# Patient Record
Sex: Male | Born: 1985 | Race: White | Hispanic: No | Marital: Married | Smoking: Current every day smoker
Health system: Southern US, Community
[De-identification: ages and names within clinical notes are randomized; demographics above are authoritative.]

## PROBLEM LIST (undated history)

## (undated) HISTORY — PX: JOINT REPLACEMENT: SHX530

---

## 2020-09-11 ENCOUNTER — Encounter (HOSPITAL_COMMUNITY): Payer: Self-pay | Admitting: Emergency Medicine

## 2020-09-11 ENCOUNTER — Other Ambulatory Visit: Payer: Self-pay

## 2020-09-11 DIAGNOSIS — Y990 Civilian activity done for income or pay: Secondary | ICD-10-CM | POA: Diagnosis not present

## 2020-09-11 DIAGNOSIS — M79675 Pain in left toe(s): Secondary | ICD-10-CM | POA: Insufficient documentation

## 2020-09-11 DIAGNOSIS — S99922A Unspecified injury of left foot, initial encounter: Secondary | ICD-10-CM | POA: Diagnosis not present

## 2020-09-11 DIAGNOSIS — X500XXA Overexertion from strenuous movement or load, initial encounter: Secondary | ICD-10-CM | POA: Insufficient documentation

## 2020-09-11 DIAGNOSIS — F17210 Nicotine dependence, cigarettes, uncomplicated: Secondary | ICD-10-CM | POA: Insufficient documentation

## 2020-09-11 NOTE — ED Triage Notes (Signed)
Pt brought in by [facility] for c/o left toe pain from work. Pt was using dolly and hyperextend left toes.

## 2020-09-12 ENCOUNTER — Emergency Department (HOSPITAL_COMMUNITY)
Admission: EM | Admit: 2020-09-12 | Discharge: 2020-09-12 | Disposition: A | Discharge status: Home / Self Care | Payer: Worker's Compensation | Attending: Emergency Medicine | Admitting: Emergency Medicine

## 2020-09-12 ENCOUNTER — Emergency Department (HOSPITAL_COMMUNITY): Payer: Worker's Compensation

## 2020-09-12 DIAGNOSIS — M79673 Pain in unspecified foot: Secondary | ICD-10-CM

## 2020-09-12 MED ORDER — OXYCODONE HCL 5 MG PO TABS: 5.0000 mg | ORAL_TABLET | ORAL | 0 refills | Status: AC | PRN

## 2020-09-12 MED ORDER — HYDROCODONE-ACETAMINOPHEN 5-325 MG PO TABS
1.0000 | ORAL_TABLET | Freq: Once | ORAL | Status: AC
  Administered 2020-09-12: 1 via ORAL
  Filled 2020-09-12: qty 1

## 2020-09-12 NOTE — Discharge Instructions (Signed)
You were evaluated in the Emergency Department and after careful evaluation, we did not find any emergent condition requiring admission or further testing in the hospital.  Your exam/testing today is overall reassuring.  X-ray did not show any broken bones or emergencies.  Recommend rest, Tylenol, Motrin.  Can use the oxycodone tablets for more significant pain.  If still hurting after 2 weeks recommend follow-up with an orthopedic specialist.  Please return to the Emergency Department if you experience any worsening of your condition.   Thank you for allowing [place] to be a part of your care.

## 2020-09-12 NOTE — ED Provider Notes (Signed)
[facility]-EMERGENCY DEPT [facility] Emergency Department Provider Note MRN:  [id number]  Arrival date & time: 09/12/20     Chief Complaint   Toe Injury   History of Present Illness   Barry Huff is a 35 y.o. year-old male with no pertinent past medical history presenting to the ED with chief complaint of toe pain.  Patient was at work trying to stop a heavy object from sliding.  Tried to hold his foot out.  Object was over 200 pounds.  Felt hyperextension of his toes and is having continued foot pain since then.  Denies any other injuries.  Pain is moderate, constant, worse with motion or palpation.  Review of Systems  A complete 10 system review of systems was obtained and all systems are negative except as noted in the HPI and PMH.   Patient's Health History   No past medical history on file.    No family history on file.  Social History   Socioeconomic History   Marital status: Married    Spouse name: Not on file   Number of children: Not on file   Years of education: Not on file   Highest education level: Not on file  Occupational History   Not on file  Tobacco Use   Smoking status: Every Day    Types: Cigarettes   Smokeless tobacco: Never  Substance and Sexual Activity   Alcohol use: Not on file   Drug use: Not on file   Sexual activity: Not on file  Other Topics Concern   Not on file  Social History Narrative   Not on file   Social Determinants of Health   Financial Resource Strain: Not on file  Food Insecurity: Not on file  Transportation Needs: Not on file  Physical Activity: Not on file  Stress: Not on file  Social Connections: Not on file  Intimate Partner Violence: Not on file     Physical Exam   Vitals:   09/11/20 2359  BP: (!) 147/91  Pulse: 89  Resp: 18  Temp: 98.5 F (36.9 C)  SpO2: 100%    CONSTITUTIONAL: Well-appearing, NAD NEURO:  Alert and oriented x 3, no focal deficits EYES:  eyes equal and reactive ENT/NECK:  no LAD, no  JVD CARDIO: Regular rate, well-perfused, normal S1 and S2 PULM:  CTAB no wheezing or rhonchi GI/GU:  normal bowel sounds, non-distended, non-tender MSK/SPINE:  No gross deformities, no edema SKIN:  no rash, atraumatic PSYCH:  Appropriate speech and behavior  *Additional and/or pertinent findings included in MDM below  Diagnostic and Interventional Summary    EKG Interpretation  Date/Time:    Ventricular Rate:    PR Interval:    QRS Duration:   QT Interval:    QTC Calculation:   R Axis:     Text Interpretation:         Labs Reviewed - No data to display  DG Foot Complete Left  Final Result      Medications  HYDROcodone-acetaminophen (NORCO/VICODIN) 5-325 MG per tablet 1 tablet (has no administration in time range)     Procedures  /  Critical Care Procedures  ED Course and Medical Decision Making  I have reviewed the triage vital signs, the nursing notes, and pertinent available records from the EMR.  Listed above are laboratory and imaging tests that I personally ordered, reviewed, and interpreted and then considered in my medical decision making (see below for details).  Foot is overall nontraumatic in appearance.  There is  tenderness across the MTPs.  X-ray is normal.  Neurovascularly intact.  No other injuries.  Advised orthopedic follow-up if still hurting after 2 weeks.  Appropriate for discharge.       Elmer Sow. Pilar Plate, MD [facility] Emergency [facility] [email or web address]  Final Clinical Impressions(s) / ED Diagnoses     ICD-10-CM   1. Pain of foot, unspecified laterality  M79.673       ED Discharge Orders          Ordered    oxyCODONE (ROXICODONE) 5 MG immediate release tablet  Every 4 hours PRN        09/12/20 0302             Discharge Instructions Discussed with and Provided to Patient:    Discharge Instructions      You were evaluated in the Emergency Department and after careful evaluation, we did not  find any emergent condition requiring admission or further testing in the hospital.  Your exam/testing today is overall reassuring.  X-ray did not show any broken bones or emergencies.  Recommend rest, Tylenol, Motrin.  Can use the oxycodone tablets for more significant pain.  If still hurting after 2 weeks recommend follow-up with an orthopedic specialist.  Please return to the Emergency Department if you experience any worsening of your condition.   Thank you for allowing [place] to be a part of your care.       Sabas Sous, MD 09/12/20 [phone]

## 2022-06-28 IMAGING — DX DG FOOT COMPLETE 3+V*L*
3 series · 3 of 3 positions shown · non-contrast
Comparison: None.

CLINICAL DATA: Hyperextension of the left toes

EXAM:
LEFT FOOT - COMPLETE 3+ VIEW

[foot ap]
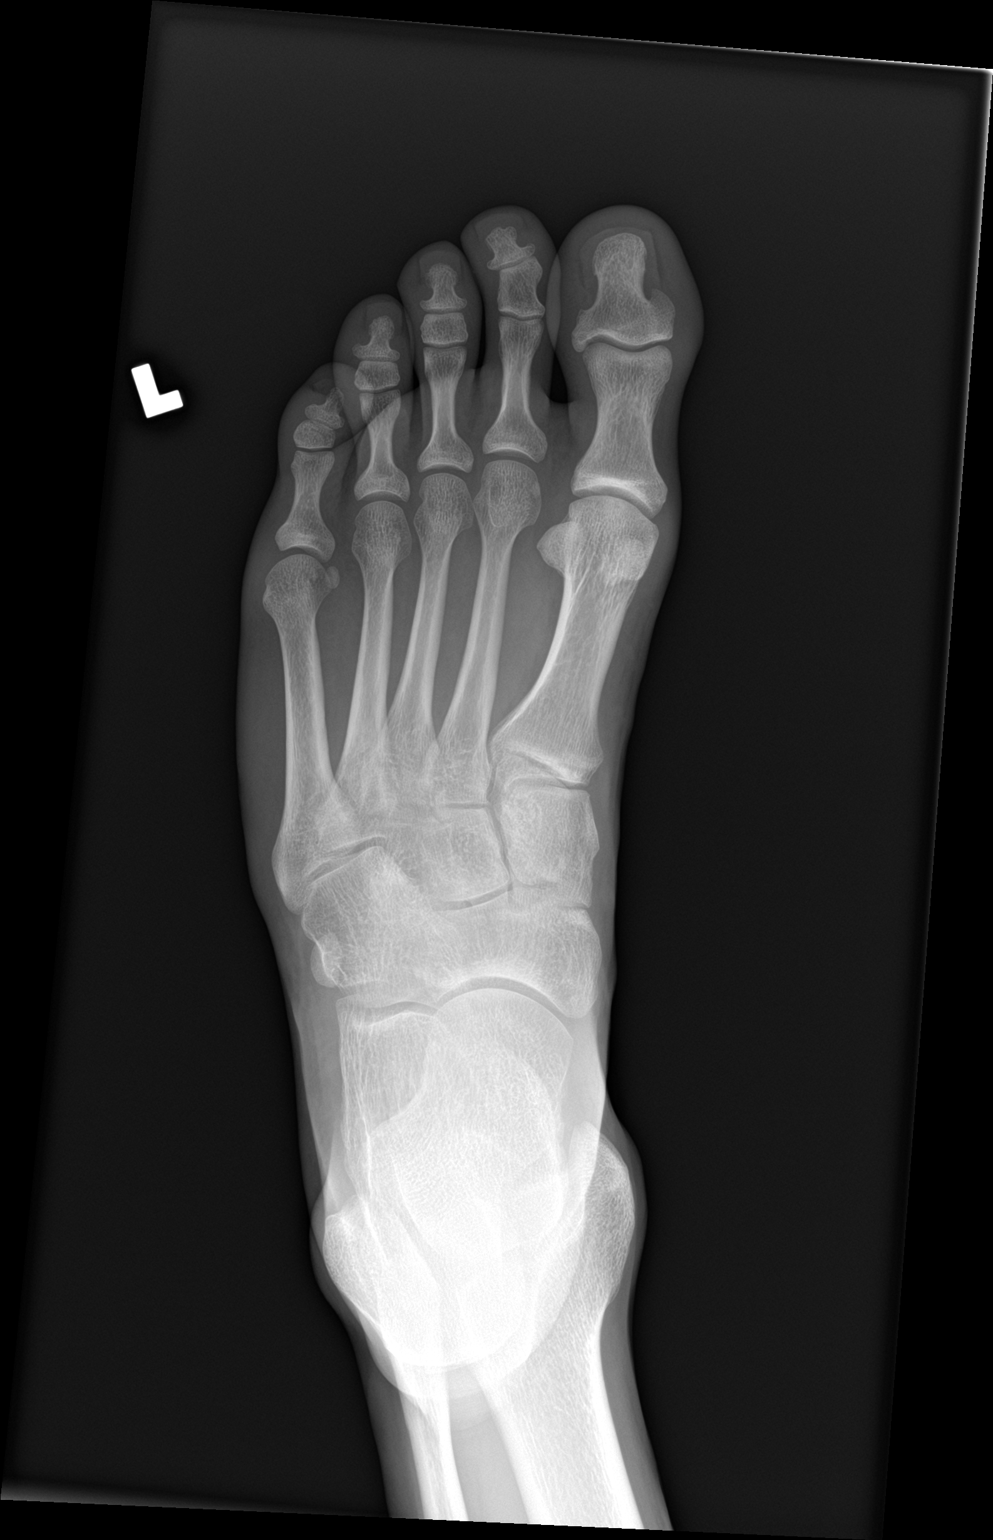

[foot obl]
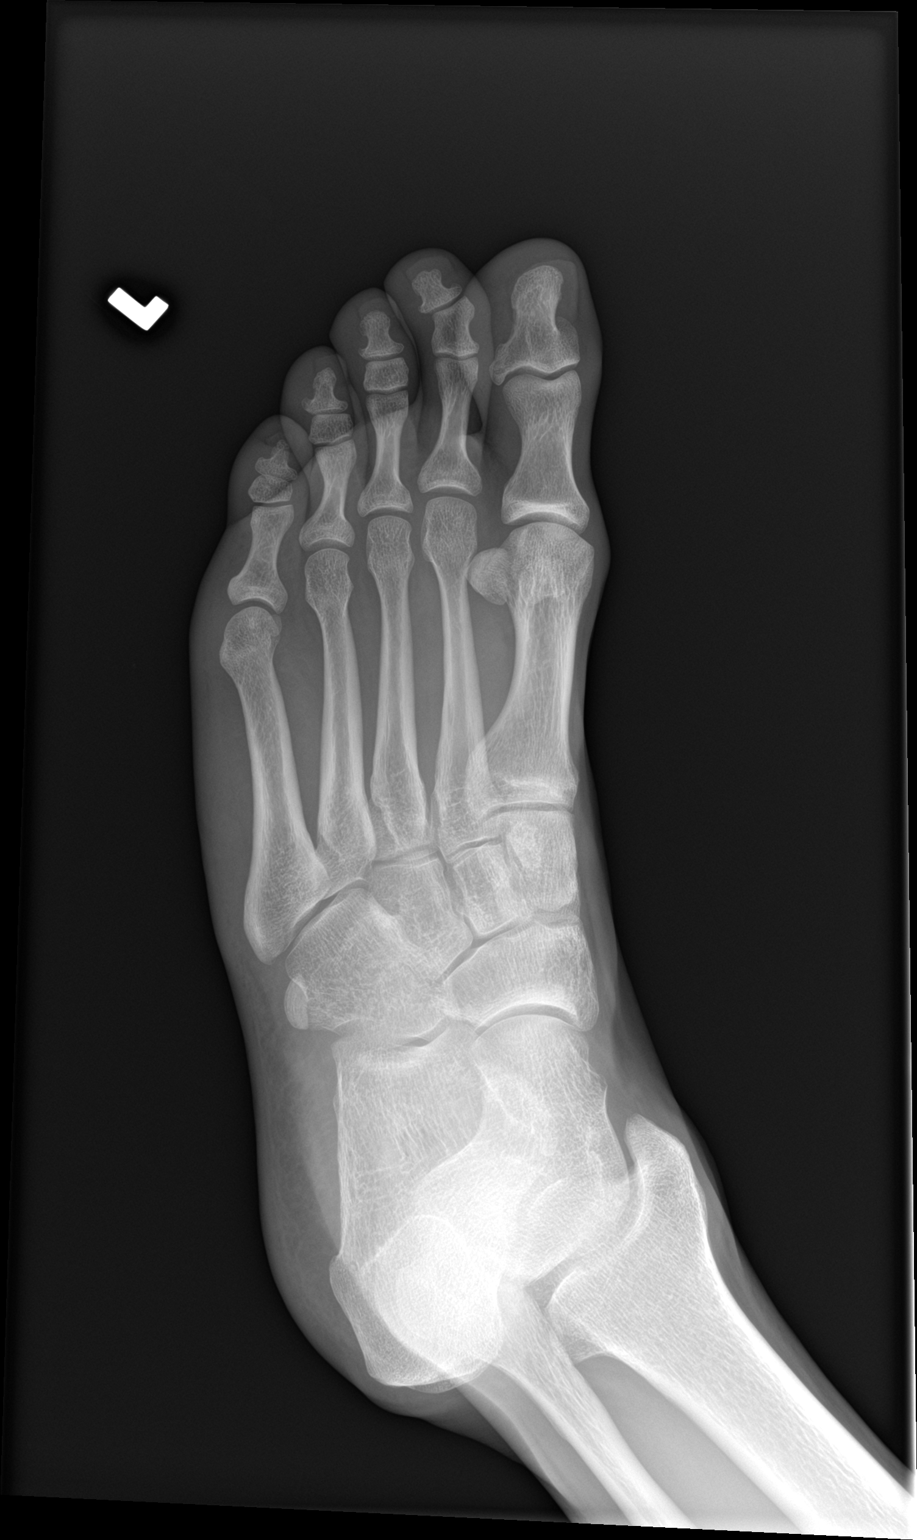

[foot lat]
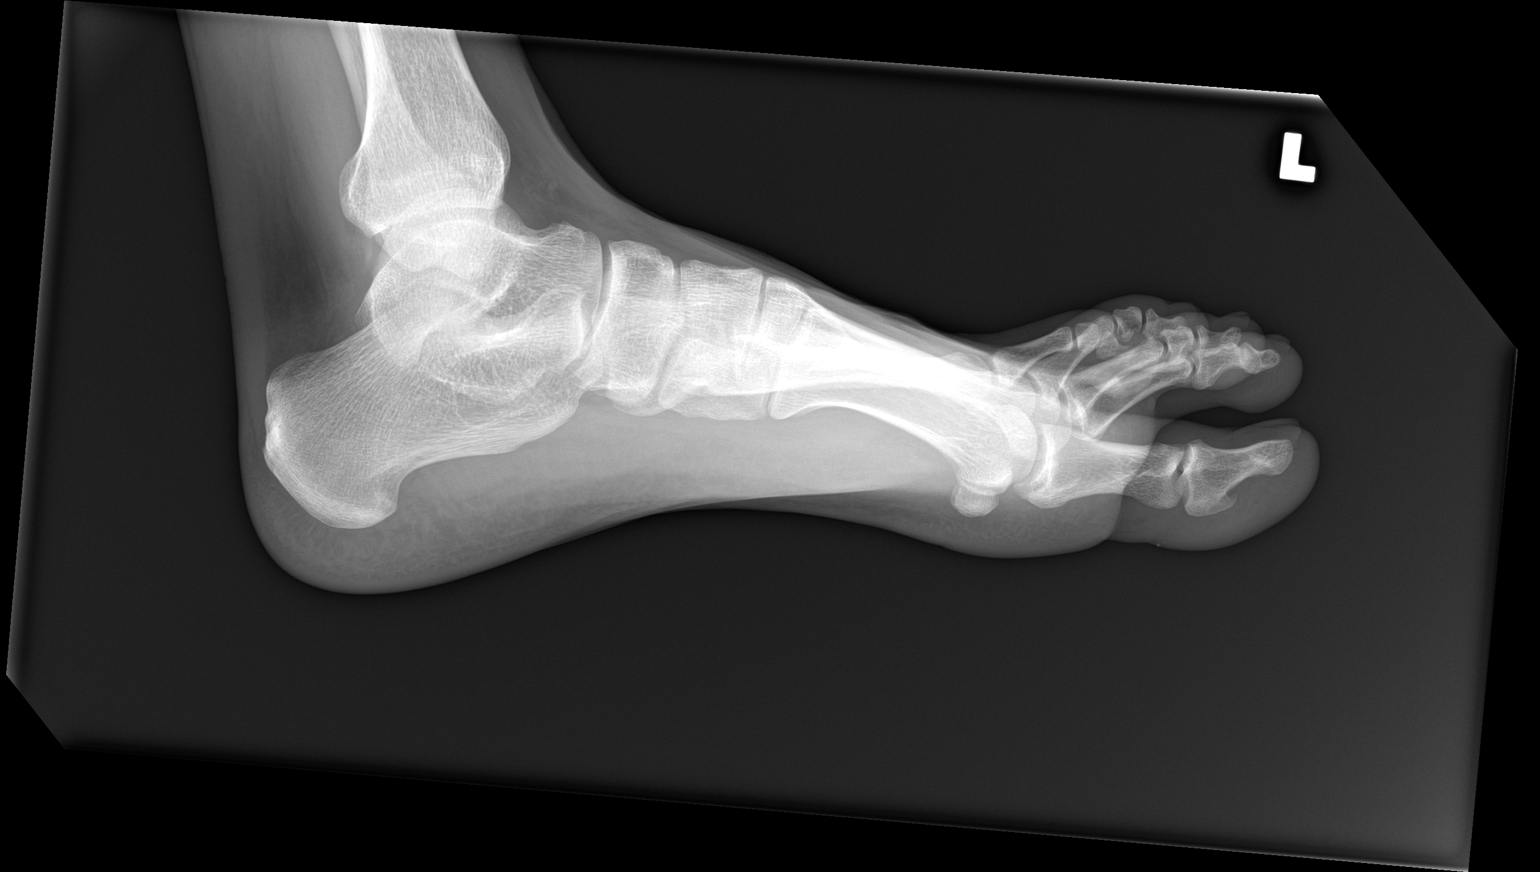

[3 of 3 positions shown; findings below may reference images not displayed]

FINDINGS: There is no evidence of fracture or dislocation. Well corticated os
peroneum, normal variant. There is no evidence of arthropathy or
other focal bone abnormality. Soft tissues are unremarkable.
IMPRESSION: Negative.

## 2023-08-29 ENCOUNTER — Emergency Department: Admission: EM | Admit: 2023-08-29 | Discharge: 2023-08-29 | Disposition: A | Discharge status: Home / Self Care

## 2023-08-29 ENCOUNTER — Other Ambulatory Visit: Payer: Self-pay

## 2023-08-29 ENCOUNTER — Encounter: Payer: Self-pay | Admitting: Emergency Medicine

## 2023-08-29 DIAGNOSIS — D72829 Elevated white blood cell count, unspecified: Secondary | ICD-10-CM | POA: Diagnosis not present

## 2023-08-29 DIAGNOSIS — R002 Palpitations: Secondary | ICD-10-CM

## 2023-08-29 DIAGNOSIS — E860 Dehydration: Secondary | ICD-10-CM | POA: Insufficient documentation

## 2023-08-29 LAB — COMPREHENSIVE METABOLIC PANEL WITH GFR
ALT: 16 U/L (ref 0–44)
AST: 27 U/L (ref 15–41)
Albumin: 4.4 g/dL (ref 3.5–5.0)
Alkaline Phosphatase: 65 U/L (ref 38–126)
Anion gap: 14 (ref 5–15)
BUN: 16 mg/dL (ref 6–20)
CO2: 18 mmol/L — ABNORMAL LOW (ref 22–32)
Calcium: 9.1 mg/dL (ref 8.9–10.3)
Chloride: 105 mmol/L (ref 98–111)
Creatinine, Ser: 0.82 mg/dL (ref 0.61–1.24)
GFR, Estimated: >60 mL/min (ref >60)
Glucose, Bld: 75 mg/dL (ref 70–99)
Potassium: 4.1 mmol/L (ref 3.5–5.1)
Sodium: 137 mmol/L (ref 135–145)
Total Bilirubin: 0.8 mg/dL (ref 0.0–1.2)
Total Protein: 7.3 g/dL (ref 6.5–8.1)

## 2023-08-29 LAB — CBC
HCT: 44.8 % (ref 39.0–52.0)
Hemoglobin: 15.2 g/dL (ref 13.0–17.0)
MCH: 32.1 pg (ref 26.0–34.0)
MCHC: 33.9 g/dL (ref 30.0–36.0)
MCV: 94.5 fL (ref 80.0–100.0)
Platelets: 274 K/uL (ref 150–400)
RBC: 4.74 MIL/uL (ref 4.22–5.81)
RDW: 13.1 % (ref 11.5–15.5)
WBC: 11.8 K/uL — ABNORMAL HIGH (ref 4.0–10.5)
nRBC: 0 % (ref 0.0–0.2)

## 2023-08-29 LAB — URINALYSIS, ROUTINE W REFLEX MICROSCOPIC
Bilirubin Urine: NEGATIVE
Glucose, UA: NEGATIVE mg/dL
Hgb urine dipstick: NEGATIVE
Ketones, ur: 20 mg/dL — AB
Leukocytes,Ua: NEGATIVE
Nitrite: NEGATIVE
Protein, ur: NEGATIVE mg/dL
Specific Gravity, Urine: 1.018 (ref 1.005–1.030)
pH: 5 (ref 5.0–8.0)

## 2023-08-29 LAB — CK: Total CK: 100 U/L (ref 49–397)

## 2023-08-29 MED ORDER — SODIUM CHLORIDE 0.9 % IV BOLUS
1000.0000 mL | Freq: Once | INTRAVENOUS | Status: AC
  Administered 2023-08-29: 1000 mL via INTRAVENOUS

## 2023-08-29 NOTE — ED Triage Notes (Signed)
 First Nurse Note:  Pt via [facility] from work. Pt c/o palpitation and L arm tingling. Pt has a hx of indigestion. Denies palpitations now, reports possible dehydration. Pt is A&OX4 and NAD Fire Dept gave 324 ASA and EMS gave Bolus  Initial HR 150, EMS reports 134 HR  150/85 BP  70 CBG

## 2023-08-29 NOTE — Discharge Instructions (Addendum)
 Your evaluated in the ED for dizziness and palpitations that have resolved.  Your lab work is reassuring.  Your EKG is normal.  You received IV fluids today.  You will need to continue to rehydrate yourself at home.  Please review patient education.  Get plenty of rest and stay hydrated.  Increase your fluid intake considering water, Gatorade or liquid IV packets.  Follow-up with your primary care provider in 1 week.  A list has been provided for you below.  Please go to the following website to schedule new (and existing) patient appointments:   [email or web address]   The following is a list of primary care offices in the area who are accepting new patients at this time.  Please reach out to one of them directly and let them know you would like to schedule an appointment to follow up on an Emergency Department visit, and/or to establish a new primary care provider (PCP).  There are likely other primary care clinics in the are who are accepting new patients, but this is an excellent place to start:  [facility] Lead physician: Dr Jon Eva [address] [place], [place] [address] [phone]  [facility] Lead Physician: Dr Dorette Loron [address], [place], [place] [address] [phone]  [facility]  Lead Physician: Dr Duwaine Louder [address] [place], [place] [address] [phone]  [facility] Lead Physician: Dr Marolyn Officer [address] [place], [place] [address] [phone]  [facility] at [facility] Lead Physician: Dr Leita Adie [address] [place], [place], [place] [address] [phone]

## 2023-08-29 NOTE — ED Triage Notes (Signed)
 Pt reports driving to work when he felt his hear racing, his left arm went numb and he became lightheaded.  Pt states all symptoms have subsided at this time, episode lasted approximately 20 mins.

## 2023-08-29 NOTE — ED Provider Notes (Signed)
 [facility] Emergency Department Provider Note     Event Date/Time   First MD Initiated Contact with Patient 08/29/23 1815     (approximate)   History   Palpitations and Dizziness   HPI  Barry Huff is a 38 y.o. male with no significant past medical history presents to the ED for evaluation of heart palpitations and feeling lightheaded.  Patient reports he felt lightheaded before going into work this morning.  While at work he reports heart palpitations with a tingling sensation radiating down his left arm.  During my evaluation with the patient he states complete resolution of symptoms.  Denies chest pain, shortness of breath, nausea, vomiting and numbness or weakness.  Patient reports he has not had anything to eat all day.     Physical Exam   Triage Vital Signs: ED Triage Vitals  Encounter Vitals Group     BP 08/29/23 1617 125/81     Girls Systolic BP Percentile --      Girls Diastolic BP Percentile --      Boys Systolic BP Percentile --      Boys Diastolic BP Percentile --      Pulse Rate 08/29/23 1617 (!) 103     Resp 08/29/23 1617 18     Temp 08/29/23 1617 98.3 F (36.8 C)     Temp Source 08/29/23 1617 Oral     SpO2 08/29/23 1617 99 %     Weight 08/29/23 1618 140 lb (63.5 kg)     Height 08/29/23 1618 5' 10 (1.778 m)     Head Circumference --      Peak Flow --      Pain Score 08/29/23 1618 0     Pain Loc --      Pain Education --      Exclude from Growth Chart --     Most recent vital signs: Vitals:   08/29/23 1617 08/29/23 2027  BP: 125/81 131/83  Pulse: (!) 103 70  Resp: 18 16  Temp: 98.3 F (36.8 C)   SpO2: 99% 100%    General: Well appearing and comfortable. Alert and oriented. INAD.  Skin:  Warm, dry and intact. No rashes or lesions noted.     Head:  NCAT.  Eyes:  PERRLA. EOMI.  CV:  Good peripheral perfusion. RRR. No peripheral edema.  RESP:  Normal effort. LCTAB. ABD:  No distention. Soft, Non tender.  MSK:    Full ROM in all joints. No swelling, deformity or tenderness.  NEURO: Cranial nerves  intact. No focal deficits. Speech clear. Sensation and motor function intact. Normal muscle strength of UE & LE. Gait is steady.     ED Results / Procedures / Treatments   Labs (all labs ordered are listed, but only abnormal results are displayed) Labs Reviewed  COMPREHENSIVE METABOLIC PANEL WITH GFR - Abnormal; Notable for the following components:      Result Value   CO2 18 (*)    All other components within normal limits  CBC - Abnormal; Notable for the following components:   WBC 11.8 (*)    All other components within normal limits  URINALYSIS, ROUTINE W REFLEX MICROSCOPIC - Abnormal; Notable for the following components:   Color, Urine YELLOW (*)    APPearance CLEAR (*)    Ketones, ur 20 (*)    All other components within normal limits  CK    EKG  Sinus tachycardia No results found.  PROCEDURES:  Critical Care  performed: No  Procedures   MEDICATIONS ORDERED IN ED: Medications  sodium chloride  0.9 % bolus 1,000 mL (1,000 mLs Intravenous New Bag/Given 08/29/23 1917)    IMPRESSION / MDM / ASSESSMENT AND PLAN / ED COURSE  I reviewed the triage vital signs and the nursing notes.                              Clinical Course as of 08/29/23 2104  Thu Aug 29, 2023  1905 ED EKG Sinus tachycardia otherwise normal. [MH]  2059 WBC(!): 11.8 Mild leukocytosis [MH]  2059 Urinalysis, Routine w reflex microscopic -Urine, Clean Catch(!) Ketones noted otherwise normal [MH]    Clinical Course User Index [MH] Margrette, Eulogia Dismore A, PA-C   38 y.o. male presents to the emergency department for evaluation and treatment of palpitations and dizziness. See HPI for further details.   Differential diagnosis includes, but is not limited to electrolyte abnormality, dehydration, rhabdomyolysis  Patient's presentation is most consistent with acute complicated illness / injury requiring diagnostic  workup.  On initial assessment patient is well-appearing and in no acute distress.  Initially tachycardic of 103 bpm but otherwise vital signs are within normal limits.  Labs obtained in triage reassuring.  Mild leukocytosis noted but suspect this to be due to temporary response to body's stress.  Urinalysis reveals ketones which is consistent with patient not eating today.  Low suspicion for DKA as patient has no history of diabetes.  CK normal.  Will administer IV fluids.  Reassuring reassessment.  Patient is in stable and satisfactory condition for discharge home.  ED return precaution discussed.  Encouraged to follow-up with primary care provider.  A list of local providers accepting new patients was provided at discharge.  FINAL CLINICAL IMPRESSION(S) / ED DIAGNOSES   Final diagnoses:  Palpitations  Dehydration   Rx / DC Orders   ED Discharge Orders     None       Note:  This document was prepared using Dragon voice recognition software and may include unintentional dictation errors.    Margrette, Mills Mitton A, PA-C 08/29/23 2104    Clarine Ozell LABOR, MD 09/02/23 1535
